# Patient Record
Sex: Female | Born: 1975 | Hispanic: Yes | Marital: Married
Health system: Southern US, Community
[De-identification: ages and names within clinical notes are randomized; demographics above are authoritative.]

---

## 2005-03-20 ENCOUNTER — Ambulatory Visit: Payer: Self-pay | Admitting: Family Medicine

## 2005-09-13 ENCOUNTER — Emergency Department: Payer: Self-pay | Admitting: Unknown Physician Specialty

## 2008-04-19 ENCOUNTER — Ambulatory Visit: Payer: Self-pay | Admitting: Family Medicine

## 2008-06-15 ENCOUNTER — Observation Stay: Payer: Self-pay | Admitting: Obstetrics and Gynecology

## 2012-08-16 ENCOUNTER — Emergency Department: Payer: Self-pay | Admitting: Emergency Medicine

## 2012-08-16 LAB — BASIC METABOLIC PANEL
Anion Gap: 12 (ref 7–16)
BUN: 9 mg/dL (ref 7–18)
Calcium, Total: 8.4 mg/dL — ABNORMAL LOW (ref 8.5–10.1)
Chloride: 107 mmol/L (ref 98–107)
EGFR (Non-African Amer.): >60
Glucose: 102 mg/dL — ABNORMAL HIGH (ref 65–99)
Osmolality: 276 (ref 275–301)

## 2012-08-16 LAB — CBC
HCT: 36.7 % (ref 35.0–47.0)
MCH: 30.9 pg (ref 26.0–34.0)
MCHC: 33.9 g/dL (ref 32.0–36.0)
MCV: 91 fL (ref 80–100)
RBC: 4.02 10*6/uL (ref 3.80–5.20)
RDW: 12.9 % (ref 11.5–14.5)

## 2013-08-12 ENCOUNTER — Ambulatory Visit: Payer: Self-pay | Admitting: Family Medicine

## 2013-09-05 ENCOUNTER — Ambulatory Visit: Payer: Self-pay | Admitting: Family Medicine

## 2015-04-26 IMAGING — US US OB < 14 WEEKS - US OB TV
1 of 2 series · 13 of 28 positions shown · non-contrast
Comparison: None.

CLINICAL DATA: Uncertain dates.

EXAM:
OBSTETRIC <14 WK US AND TRANSVAGINAL OB US
TECHNIQUE: Both transabdominal and transvaginal ultrasound examinations were
performed for complete evaluation of the gestation as well as the
maternal uterus, adnexal regions, and pelvic cul-de-sac.
Transvaginal technique was performed to assess early pregnancy.

[Series 1: us ob < 14 weeks - us ob tv · 0.26mm/px · 13 of 87 slices shown]
[im 4/87]
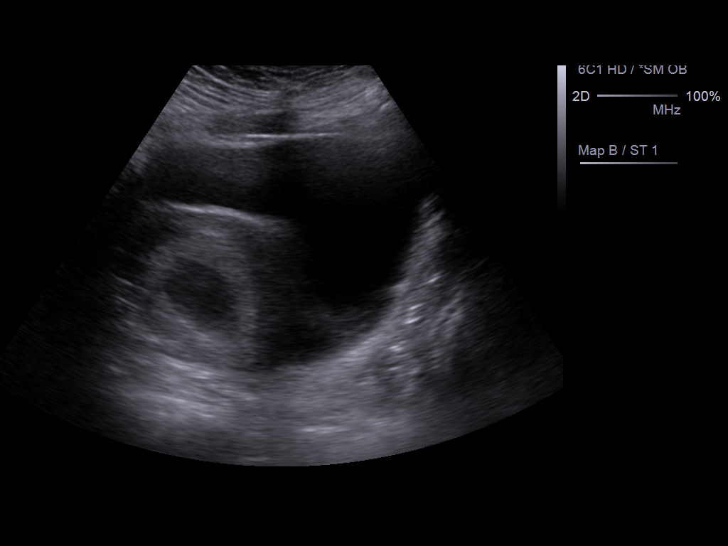
[im 10/87]
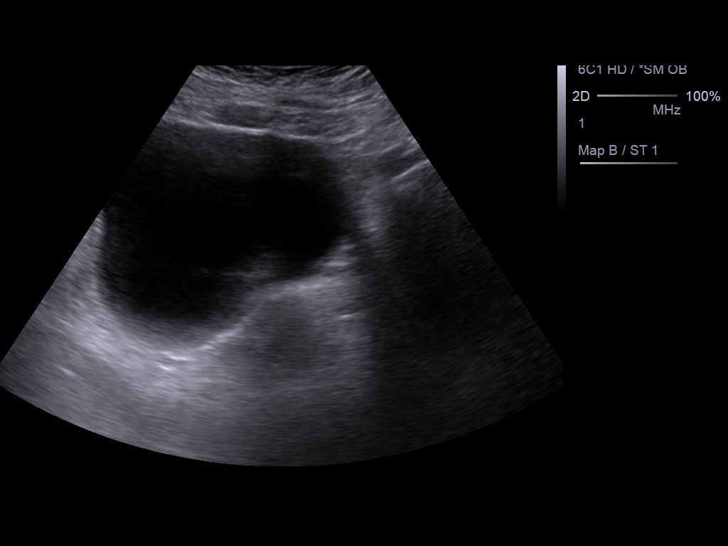
[im 17/87]
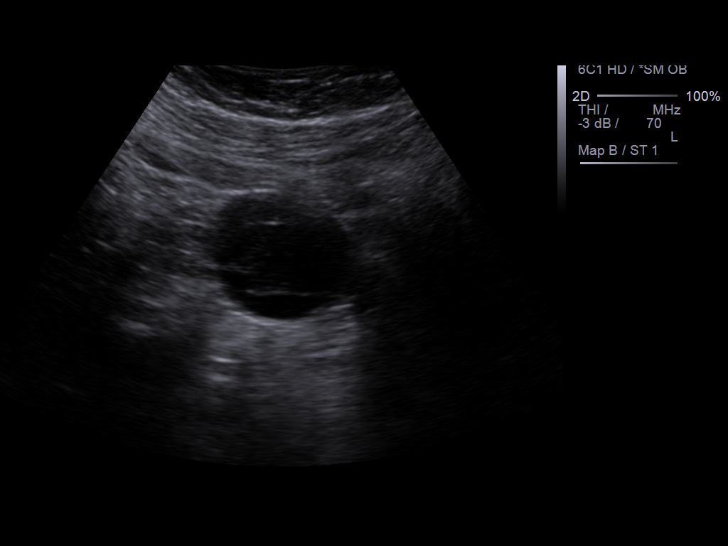
[im 24/87]
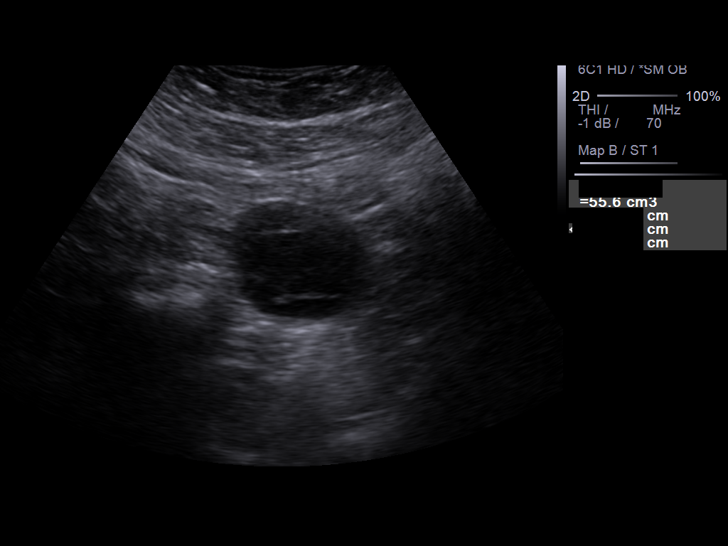
[im 30/87]
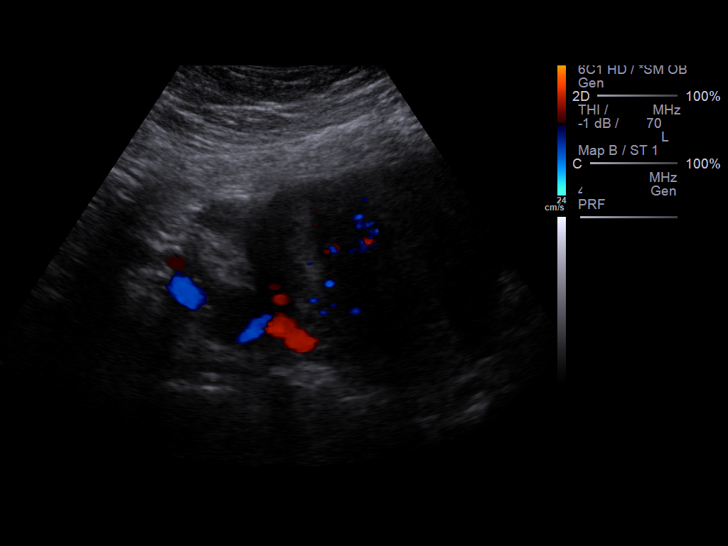
[im 37/87]
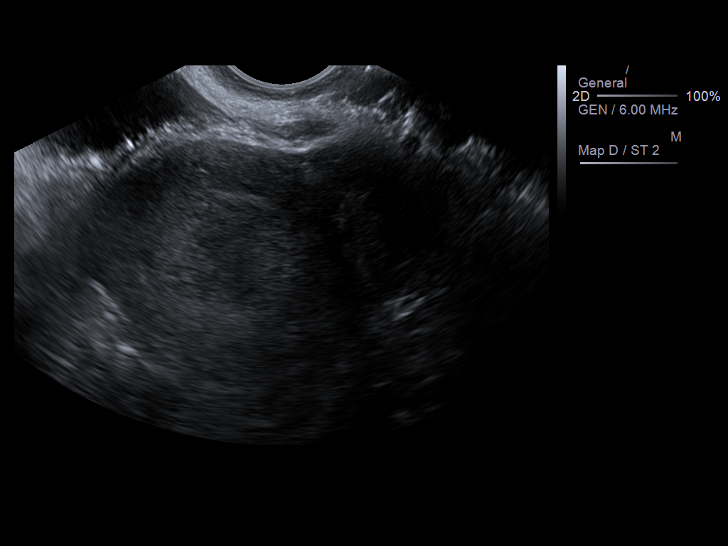
[im 47/87]
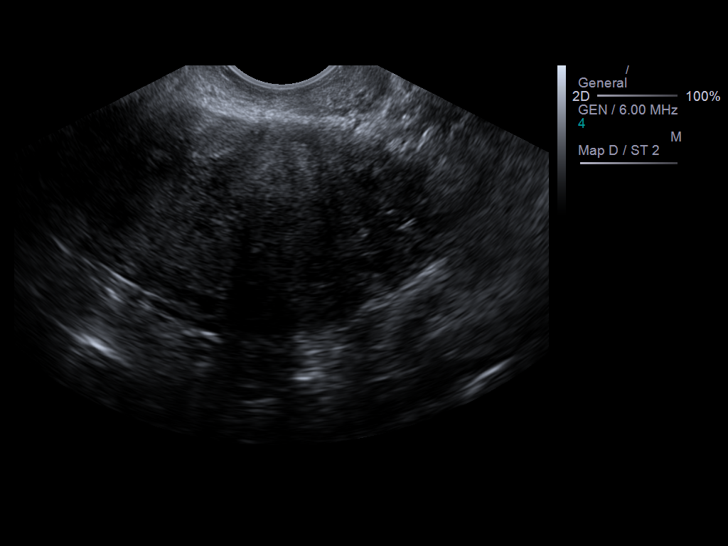
[im 53/87]
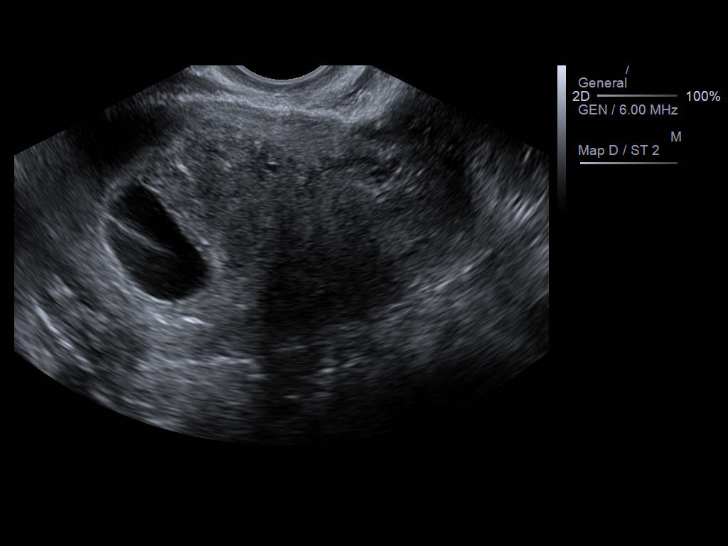
[im 60/87]
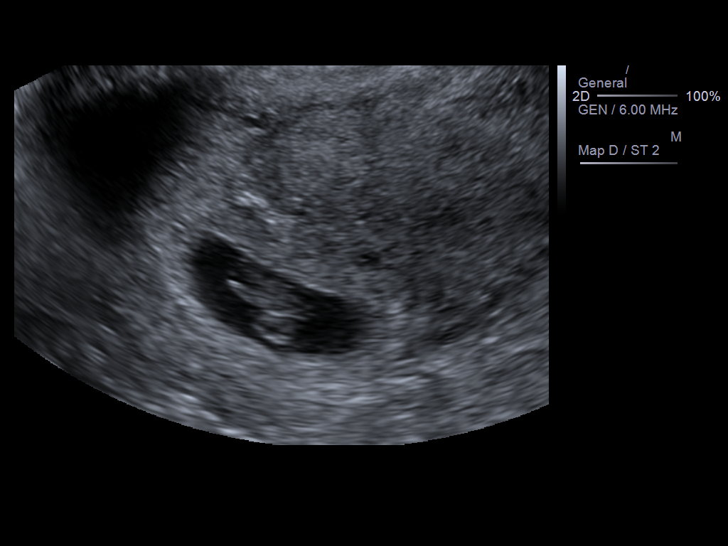
[im 67/87]
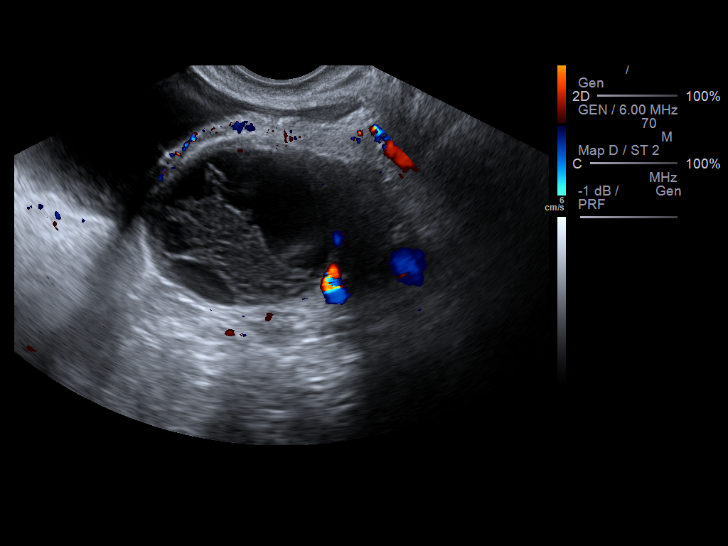
[im 73/87]
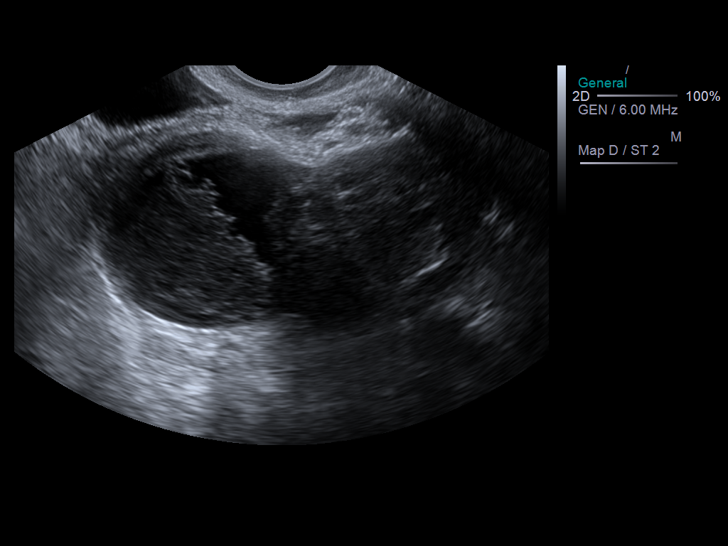
[im 80/87]
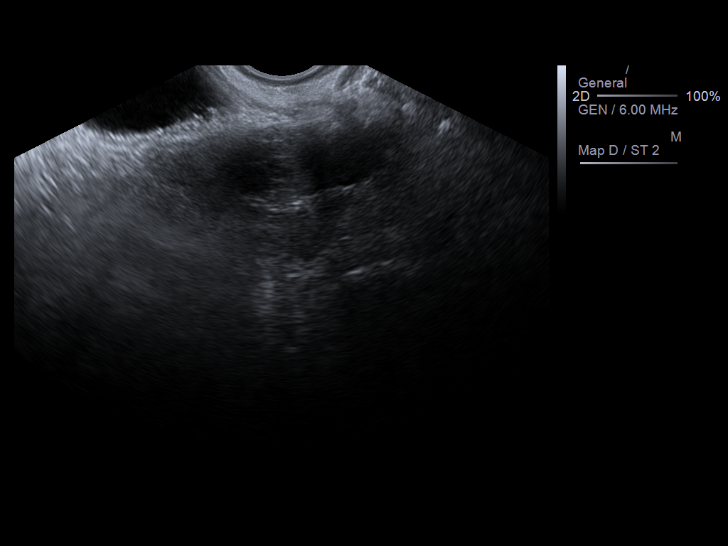
[im 87/87]
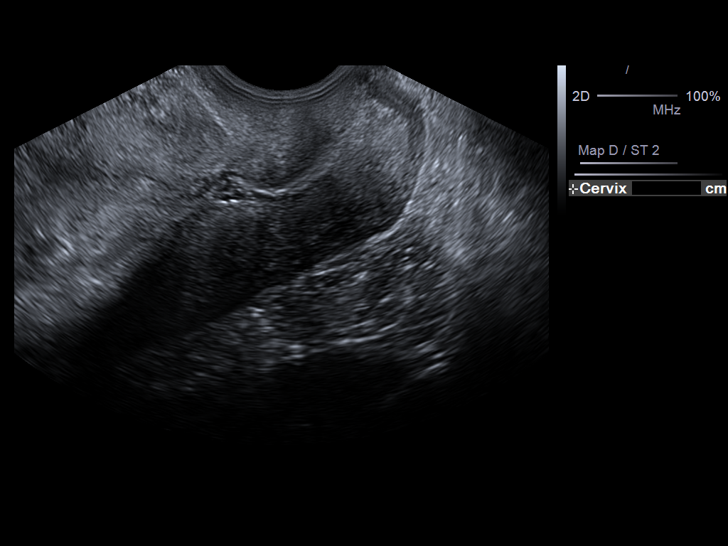

[13 of 28 positions shown; findings below may reference images not displayed]

FINDINGS: Intrauterine gestational sac: Visualized/normal in shape.

Yolk sac:  Normal in appearing

Embryo:  Single

Cardiac Activity: Present

Heart Rate:  150 bpm

CRL:   1.5 cm  mm   7 w 6 d                  US EDC: March 25, 2014

Maternal uterus/adnexae: The cervix appears closed and measures
cm in length. No subchorionic hemorrhage is demonstrated. The
maternal right ovary is normal in echotexture and contour and
measures 3.5 x 1.6 x 1.2 cm. The maternal left ovary measures 6.3 x
3.5 x 5.1 cm and there is an associated complex appearing cystic
mass measuring 4 x 3.8 x 4 cm. It does not appear hypervascular.
IMPRESSION: 1. There is a single, viable, early IUP with estimated gestational
age of 7 weeks 6 days. The estimated date of confinement is [DATE]. There is a complex left adnexal mass which is associated with or
rising from the left ovary. It is not hypervascular. This could
reflect a hemorrhagic corpus luteum cyst. This will merit follow-up
ultrasound examinations as well as serial beta HCG determinations.

## 2015-05-20 IMAGING — US US OB < 14 WEEKS
1 series · 13 of 28 positions shown · non-contrast
Comparison: Early OB ultrasound dated August 12, 2013.

CLINICAL DATA: Follow-up for ovarian cyst

EXAM:
OBSTETRIC <14 WK ULTRASOUND
TECHNIQUE: Transabdominal ultrasound was performed for evaluation of the
gestation as well as the maternal uterus and adnexal regions.

[Series 1: us ob < 14 weeks · 0.25mm/px · 13 of 44 slices shown]
[im 2/44]
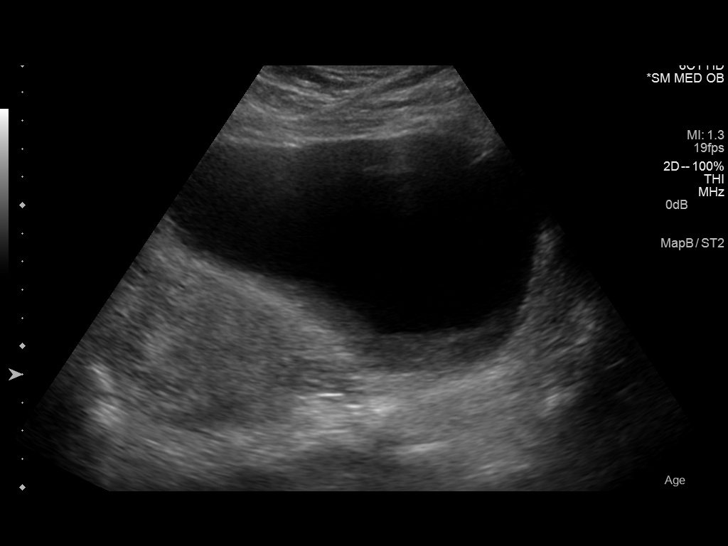
[im 5/44]
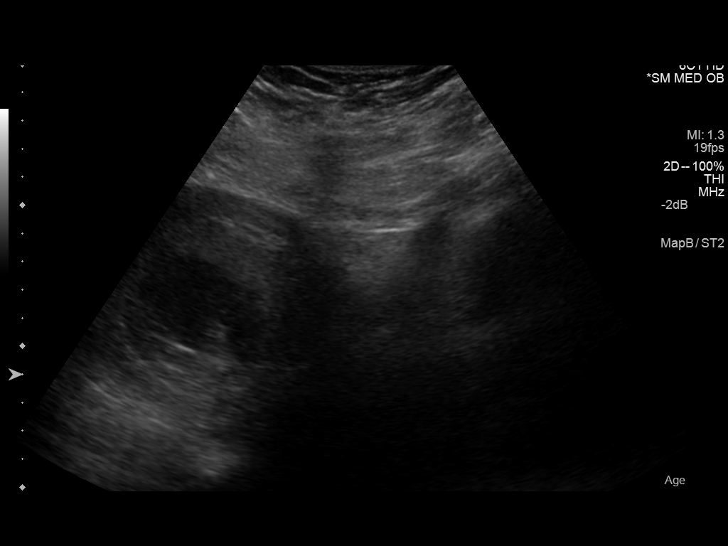
[im 8/44]
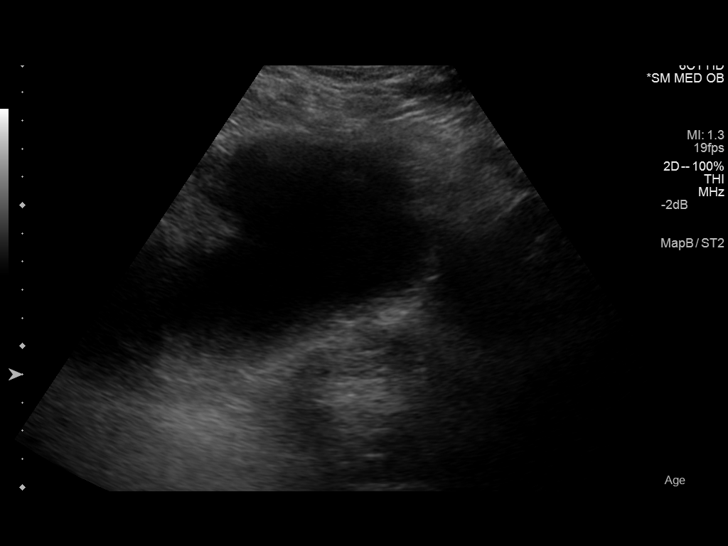
[im 12/44]
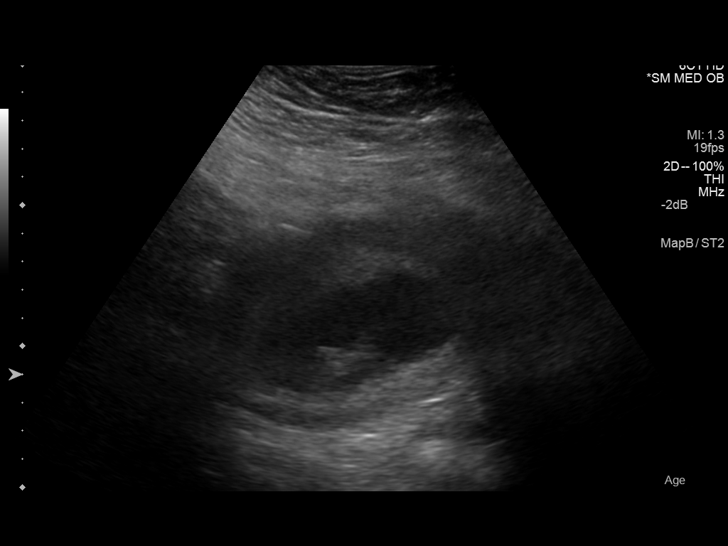
[im 15/44]
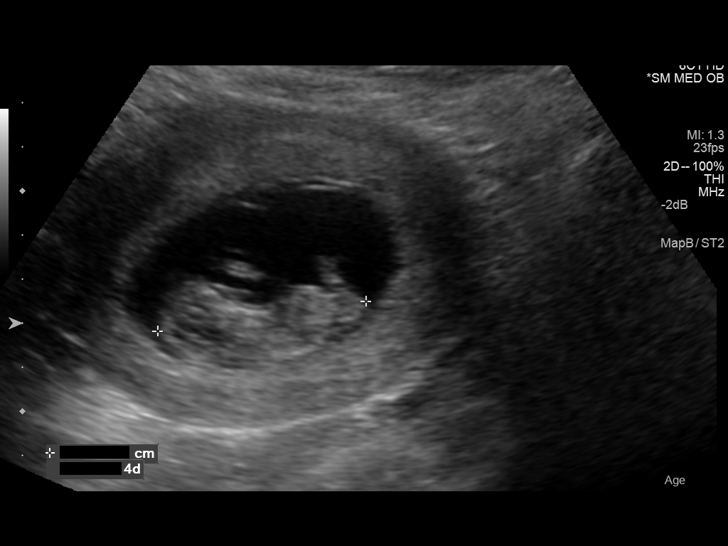
[im 18/44]
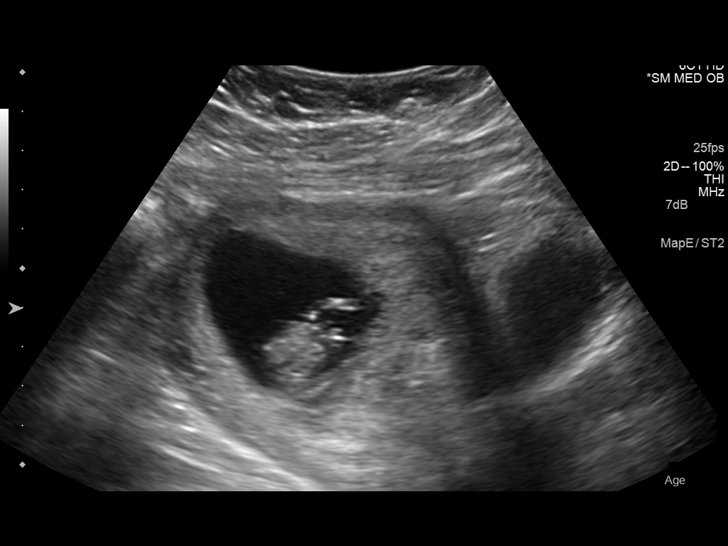
[im 23/44]
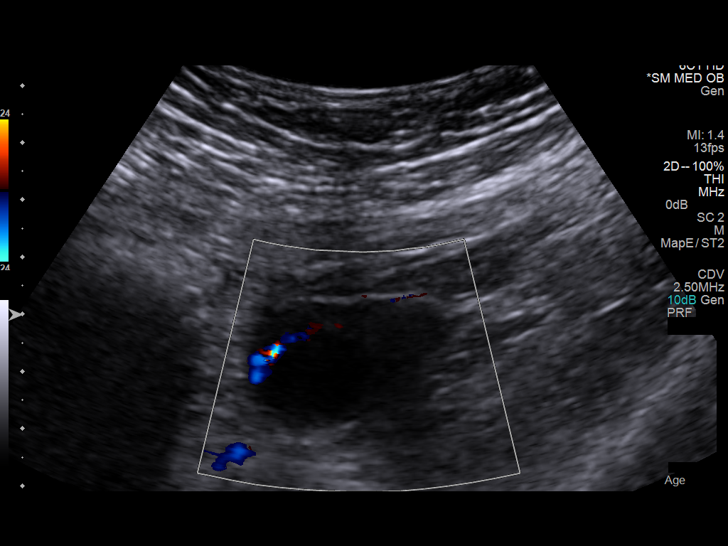
[im 26/44]
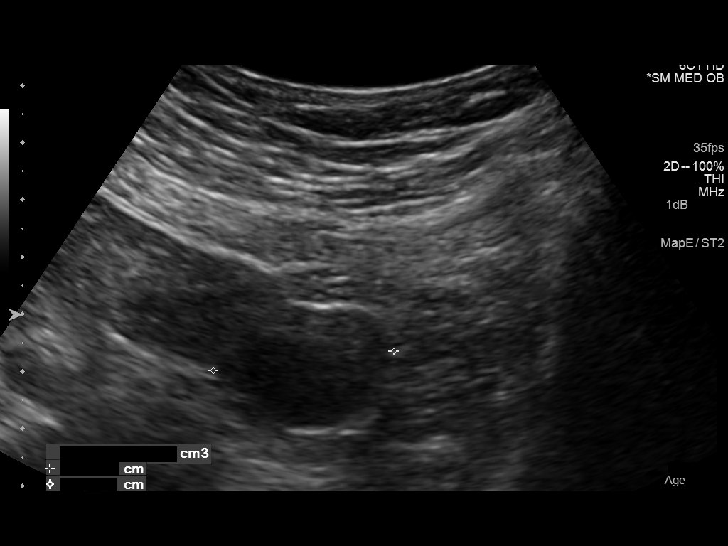
[im 29/44]
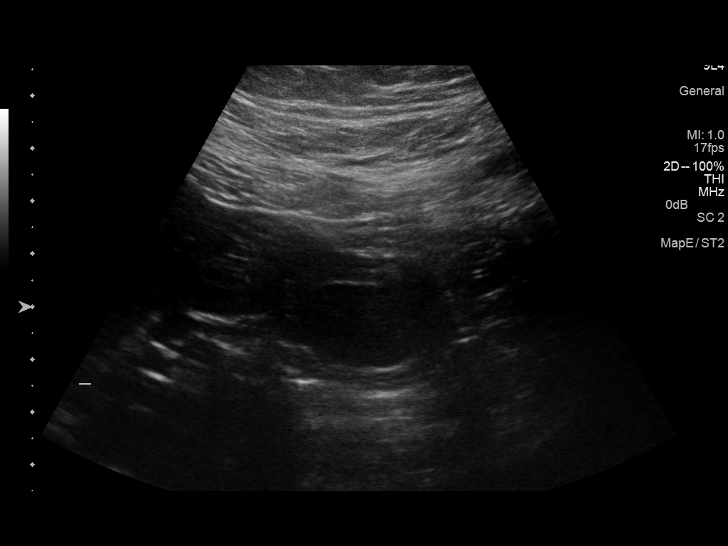
[im 32/44]
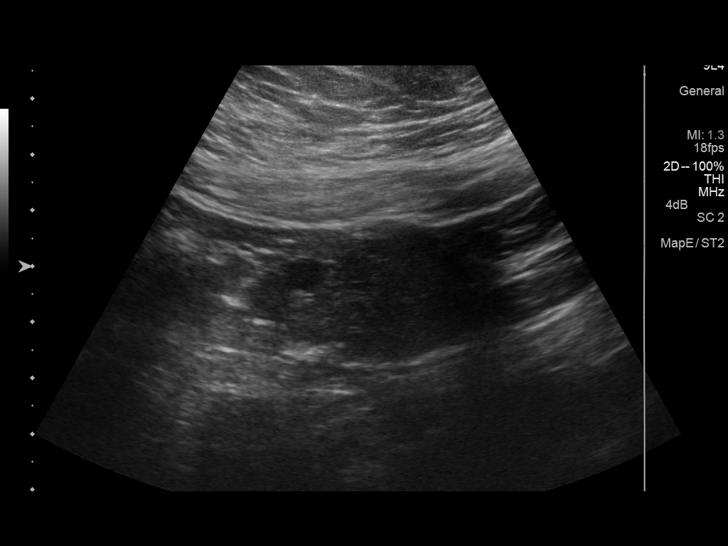
[im 36/44]
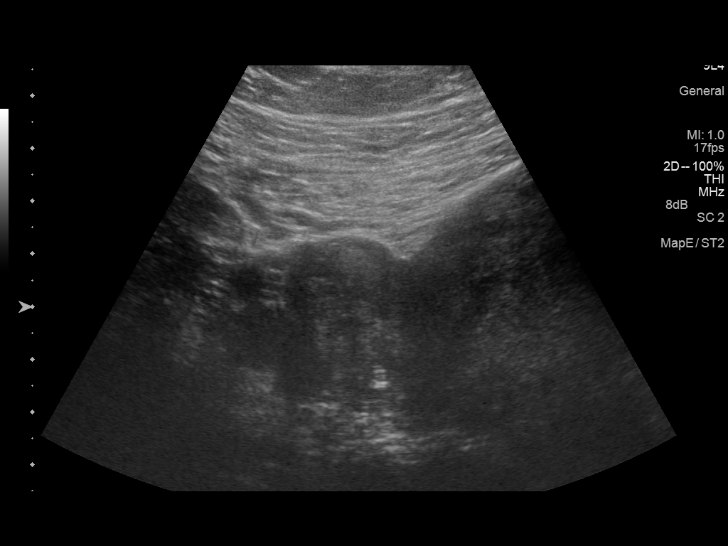
[im 39/44]
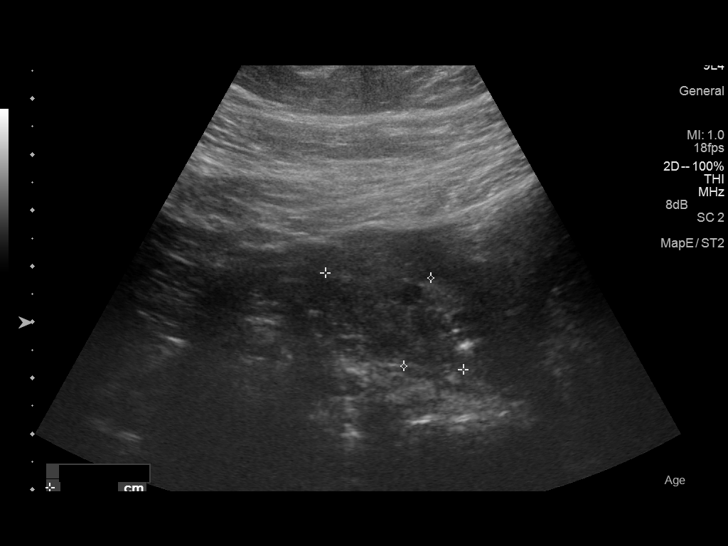
[im 42/44]
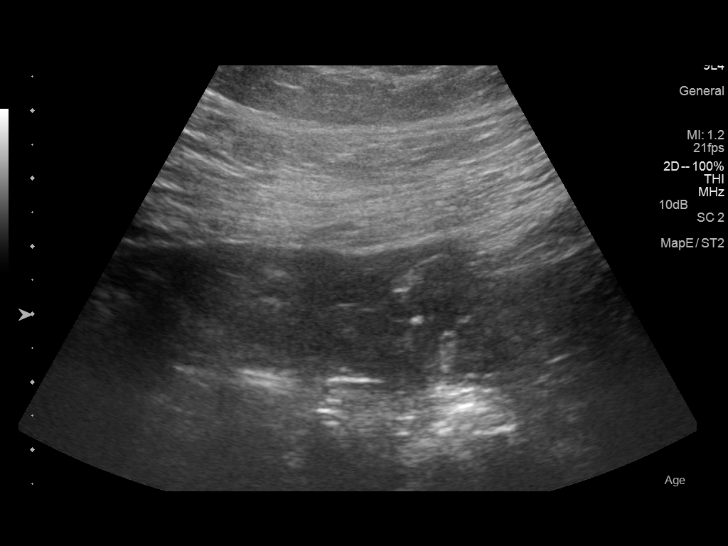

[13 of 28 positions shown; findings below may reference images not displayed]

FINDINGS: Intrauterine gestational sac: Present and normal in contour. No
subchorionic hemorrhage is demonstrated

Yolk sac:  Not visualized

Embryo:  Normal in appearance

Cardiac Activity: Present.

Heart Rate: 155 bpm

CRL: 4.9 cm mm 11 weeks w 5 days d US EDC: March 22, 2014.

Maternal uterus/adnexae: The right ovary is normal in echotexture
and contour and measures 3 x 1.7 x 1.9 cm. The left ovary measures
3.9 x 2.5 x 3.2 cm and contains a 1.7 x 1.7 x 2.2 cm diameter
anechoic structure.
IMPRESSION: 1. There is a viable IUP with estimated gestational age of 11 weeks
5 days. This is in reasonable remain with the patient's previous
study.
2. There is an anechoic structure associated with the left ovary
which appears smaller than that seen on the previous study and the
complex internal echoes are not demonstrated. This may reflect a
corpus luteum cyst. An additional 1 month follow-up ultrasound is
recommended.
3. The right ovary is normal in appearance.

## 2020-01-14 ENCOUNTER — Ambulatory Visit: Payer: Self-pay | Attending: Internal Medicine

## 2020-01-14 DIAGNOSIS — Z23 Encounter for immunization: Secondary | ICD-10-CM

## 2020-01-14 NOTE — Progress Notes (Signed)
   Covid-19 Vaccination Clinic  Name:  Alicia Walker    MRN: [id number] DOB: [birth year]  01/14/2020  [name] was observed post Covid-19 immunization for 15 minutes without incident. She was provided with Vaccine Information Sheet and instruction to access the V-Safe system.   [name] was instructed to call 911 with any severe reactions post vaccine: Marland Kitchen Difficulty breathing  . Swelling of face and throat  . A fast heartbeat  . A bad rash all over body  . Dizziness and weakness   Immunizations Administered    Name Date Dose VIS Date Route   Pfizer COVID-19 Vaccine 01/14/2020  8:16 AM 0.3 mL 10/19/2018 Intramuscular   Manufacturer: [employer], [employer]   Lot: [id number]   NDC: [address]-5

## 2020-02-04 ENCOUNTER — Ambulatory Visit: Payer: Self-pay | Attending: Internal Medicine

## 2020-02-11 ENCOUNTER — Ambulatory Visit: Payer: Self-pay | Attending: Internal Medicine

## 2020-02-11 DIAGNOSIS — Z23 Encounter for immunization: Secondary | ICD-10-CM

## 2020-02-11 NOTE — Progress Notes (Signed)
   Covid-19 Vaccination Clinic  Name:  Alicia Walker    MRN: [id number] DOB: May 19, 1976  02/11/2020  Ms. Alicia Walker was observed post Covid-19 immunization for 15 minutes without incident. She was provided with Vaccine Information Sheet and instruction to access the V-Safe system.   Ms. Alicia Walker was instructed to call 911 with any severe reactions post vaccine: Marland Kitchen Difficulty breathing  . Swelling of face and throat  . A fast heartbeat  . A bad rash all over body  . Dizziness and weakness   Immunizations Administered    Name Date Dose VIS Date Route   Pfizer COVID-19 Vaccine 02/11/2020  9:22 AM 0.3 mL 10/19/2018 Intramuscular   Manufacturer: [employer], [employer]   Lot: [id number]   NDC: [address]-4
